# Patient Record
Sex: Female | Born: 1960 | Race: White | Hispanic: No | Smoking: Current every day smoker
Health system: Southern US, Community
[De-identification: ages and names within clinical notes are randomized; demographics above are authoritative.]

## PROBLEM LIST (undated history)

## (undated) DIAGNOSIS — J449 Chronic obstructive pulmonary disease, unspecified: Secondary | ICD-10-CM

## (undated) HISTORY — PX: OTHER SURGICAL HISTORY: SHX169

## (undated) HISTORY — DX: Chronic obstructive pulmonary disease, unspecified: J44.9

## (undated) HISTORY — PX: ECTOPIC PREGNANCY SURGERY: SHX613

---

## 2005-02-24 ENCOUNTER — Ambulatory Visit: Payer: Self-pay

## 2012-06-11 ENCOUNTER — Emergency Department: Payer: Self-pay | Admitting: Unknown Physician Specialty

## 2012-06-17 ENCOUNTER — Emergency Department: Payer: Self-pay | Admitting: Unknown Physician Specialty

## 2017-09-02 ENCOUNTER — Other Ambulatory Visit: Payer: Self-pay

## 2017-09-02 ENCOUNTER — Ambulatory Visit: Payer: Medicaid Other

## 2017-09-02 ENCOUNTER — Ambulatory Visit
Admission: EM | Admit: 2017-09-02 | Discharge: 2017-09-02 | Disposition: A | Discharge status: Home / Self Care | Payer: Medicaid Other | Attending: Family Medicine | Admitting: Family Medicine

## 2017-09-02 DIAGNOSIS — M791 Myalgia, unspecified site: Secondary | ICD-10-CM | POA: Insufficient documentation

## 2017-09-02 DIAGNOSIS — R509 Fever, unspecified: Secondary | ICD-10-CM | POA: Insufficient documentation

## 2017-09-02 DIAGNOSIS — J9801 Acute bronchospasm: Secondary | ICD-10-CM | POA: Insufficient documentation

## 2017-09-02 DIAGNOSIS — J111 Influenza due to unidentified influenza virus with other respiratory manifestations: Secondary | ICD-10-CM

## 2017-09-02 DIAGNOSIS — R05 Cough: Secondary | ICD-10-CM | POA: Diagnosis not present

## 2017-09-02 DIAGNOSIS — R69 Illness, unspecified: Secondary | ICD-10-CM | POA: Diagnosis not present

## 2017-09-02 DIAGNOSIS — R062 Wheezing: Secondary | ICD-10-CM

## 2017-09-02 DIAGNOSIS — F17210 Nicotine dependence, cigarettes, uncomplicated: Secondary | ICD-10-CM | POA: Insufficient documentation

## 2017-09-02 LAB — RAPID INFLUENZA A&B ANTIGENS: Influenza A (ARMC): NEGATIVE

## 2017-09-02 LAB — RAPID INFLUENZA A&B ANTIGENS (ARMC ONLY): INFLUENZA B (ARMC): NEGATIVE

## 2017-09-02 MED ORDER — DOXYCYCLINE HYCLATE 100 MG PO TABS: 100.0000 mg | ORAL_TABLET | Freq: Two times a day (BID) | ORAL | 0 refills | Status: DC

## 2017-09-02 MED ORDER — OSELTAMIVIR PHOSPHATE 75 MG PO CAPS: 75.0000 mg | ORAL_CAPSULE | Freq: Two times a day (BID) | ORAL | 0 refills | Status: DC

## 2017-09-02 MED ORDER — IPRATROPIUM-ALBUTEROL 0.5-2.5 (3) MG/3ML IN SOLN
3.0000 mL | Freq: Once | RESPIRATORY_TRACT | Status: AC
  Administered 2017-09-02: 3 mL via RESPIRATORY_TRACT

## 2017-09-02 MED ORDER — PREDNISONE 20 MG PO TABS: ORAL_TABLET | ORAL | 0 refills | Status: DC

## 2017-09-02 MED ORDER — ALBUTEROL SULFATE HFA 108 (90 BASE) MCG/ACT IN AERS: 1.0000 | INHALATION_SPRAY | Freq: Four times a day (QID) | RESPIRATORY_TRACT | 0 refills | Status: DC | PRN

## 2017-09-02 NOTE — ED Triage Notes (Signed)
Patient c/o fever, cough, congestion and body aches since Monday.

## 2017-09-02 NOTE — ED Provider Notes (Signed)
[facility]    CSN: [id number] Arrival date & time: 09/02/17  1653     History   Chief Complaint Chief Complaint  Patient presents with  . Fever    HPI Kim Mcintyre is a 57 y.o. female.   The history is provided by the patient.  Fever  Associated symptoms: congestion, cough and myalgias   URI  Presenting symptoms: congestion, cough and fever   Severity:  Moderate Onset quality:  Sudden Duration:  4 days Timing:  Constant Progression:  Worsening Chronicity:  New Relieved by:  None tried Ineffective treatments:  None tried Associated symptoms: myalgias and wheezing   Associated symptoms: no sinus pain   Risk factors: chronic respiratory disease (unknown however patient chronic lifelong smoker) and sick contacts   Risk factors: not elderly, no chronic cardiac disease, no chronic kidney disease, no diabetes mellitus, no immunosuppression, no recent illness and no recent travel     History reviewed. No pertinent past medical history.  There are no active problems to display for this patient.   History reviewed. No pertinent surgical history.  OB History    No data available       Home Medications    Prior to Admission medications   Medication Sig Start Date End Date Taking? Authorizing Provider  albuterol (PROVENTIL HFA;VENTOLIN HFA) 108 (90 Base) MCG/ACT inhaler Inhale 1-2 puffs into the lungs every 6 (six) hours as needed for wheezing or shortness of breath. 09/02/17   Payton Mccallumonty, Ashaad Gaertner, MD  doxycycline (VIBRA-TABS) 100 MG tablet Take 1 tablet (100 mg total) by mouth 2 (two) times daily. 09/02/17   Payton Mccallumonty, Julius Boniface, MD  oseltamivir (TAMIFLU) 75 MG capsule Take 1 capsule (75 mg total) by mouth 2 (two) times daily. 09/02/17   Payton Mccallumonty, Kingston Guiles, MD  predniSONE (DELTASONE) 20 MG tablet 3 tabs po qd x 2 days, then 2 tabs po qd x 3 days, then 1 tab po qd x 3 days, then half a tab po qd x 2 days 09/02/17   Payton Mccallumonty, Sherman Donaldson, MD    Family History Family History  Problem  Relation Age of Onset  . Cancer Mother        lung  . Cancer Father        throat    Social History Social History   Tobacco Use  . Smoking status: Current Every Day Smoker  . Smokeless tobacco: Never Used  Substance Use Topics  . Alcohol use: Not on file  . Drug use: Not on file     Allergies   Patient has no known allergies.   Review of Systems Review of Systems  Constitutional: Positive for fever.  HENT: Positive for congestion. Negative for sinus pain.   Respiratory: Positive for cough and wheezing.   Musculoskeletal: Positive for myalgias.     Physical Exam Triage Vital Signs ED Triage Vitals  Enc Vitals Group     BP 09/02/17 1734 (!) 111/48     Pulse Rate 09/02/17 1734 80     Resp --      Temp 09/02/17 1734 (!) 101.6 F (38.7 C)     Temp Source 09/02/17 1734 Oral     SpO2 09/02/17 1734 90 %     Weight 09/02/17 1735 105 lb (47.6 kg)     Height 09/02/17 1735 4\' 11"  (1.499 m)     Head Circumference --      Peak Flow --      Pain Score 09/02/17 1735 10  Pain Loc --      Pain Edu? --      Excl. in GC? --    No data found.  Updated Vital Signs BP (!) 111/48   Pulse 80   Temp (!) 101.6 F (38.7 C) (Oral)   Ht 4\' 11"  (1.499 m)   Wt 105 lb (47.6 kg)   SpO2 92%   BMI 21.21 kg/m   Visual Acuity Right Eye Distance:   Left Eye Distance:   Bilateral Distance:    Right Eye Near:   Left Eye Near:    Bilateral Near:     Physical Exam  Constitutional: She appears well-developed and well-nourished. No distress.  HENT:  Head: Normocephalic and atraumatic.  Nose: Rhinorrhea present. No mucosal edema, nose lacerations, sinus tenderness, nasal deformity, septal deviation or nasal septal hematoma. No epistaxis.  No foreign bodies. Right sinus exhibits no maxillary sinus tenderness and no frontal sinus tenderness. Left sinus exhibits no maxillary sinus tenderness and no frontal sinus tenderness.  Mouth/Throat: Uvula is midline, oropharynx is clear and  moist and mucous membranes are normal. No oropharyngeal exudate, posterior oropharyngeal edema or posterior oropharyngeal erythema. No tonsillar exudate.  Neck: Normal range of motion. Neck supple. No thyromegaly present.  Cardiovascular: Normal rate, regular rhythm and normal heart sounds.  Pulmonary/Chest: Effort normal. No stridor. No respiratory distress. She has wheezes (and diffuse rhonchi bilaterally). She has no rales.  Lymphadenopathy:    She has no cervical adenopathy.  Skin: She is not diaphoretic.  Nursing note and vitals reviewed.    UC Treatments / Results  Labs (all labs ordered are listed, but only abnormal results are displayed) Labs Reviewed  RAPID INFLUENZA A&B ANTIGENS ([facility] ONLY)    EKG  EKG Interpretation None       Radiology Dg Chest 2 View  Result Date: 09/02/2017 CLINICAL DATA:  Cough and fever EXAM: CHEST  2 VIEW COMPARISON:  Report 03/21/2003 FINDINGS: The heart size and mediastinal contours are within normal limits. Both lungs are clear. The visualized skeletal structures are unremarkable. IMPRESSION: No active cardiopulmonary disease. Electronically Signed   By: Jasmine Pang M.D.   On: 09/02/2017 18:15    Procedures Procedures (including critical care time)  Medications Ordered in UC Medications  ipratropium-albuterol (DUONEB) 0.5-2.5 (3) MG/3ML nebulizer solution 3 mL (3 mLs Nebulization Given 09/02/17 1754)     Initial Impression / Assessment and Plan / UC Course  I have reviewed the triage vital signs and the nursing notes.  Pertinent labs & imaging results that were available during my care of the patient were reviewed by me and considered in my medical decision making (see chart for details).   O2 sat after neb tx: 93-94% at rest 93% during ambulation    Final Clinical Impressions(s) / UC Diagnoses   Final diagnoses:  Influenza-like illness  Bronchospasm  Cigarette smoker    ED Discharge Orders        Ordered    doxycycline  (VIBRA-TABS) 100 MG tablet  2 times daily     09/02/17 1839    oseltamivir (TAMIFLU) 75 MG capsule  2 times daily     09/02/17 1839    predniSONE (DELTASONE) 20 MG tablet     09/02/17 1839    albuterol (PROVENTIL HFA;VENTOLIN HFA) 108 (90 Base) MCG/ACT inhaler  Every 6 hours PRN     09/02/17 1839     1. x-ray results and diagnosis reviewed with patient and daughter 2. Patient given duoneb tx x 1  with improvement of symptoms 3. rx as per orders above; reviewed possible side effects, interactions, risks and benefits  4. Recommend supportive treatment with rest, fluids  5. Follow-up prn if symptoms worsen or don't improve  Controlled Substance Prescriptions [place] Controlled Substance Registry consulted? Not Applicable   Payton Mccallum, MD 09/02/17 [phone]

## 2017-09-05 ENCOUNTER — Telehealth: Payer: Self-pay

## 2017-09-05 NOTE — Telephone Encounter (Signed)
Called to follow up with patient since visit here at [facility]. Spoke with pt, reports improvement.Patient instructed to call back with any questions or concerns. MAH  

## 2017-09-06 ENCOUNTER — Ambulatory Visit: Payer: Self-pay | Admitting: Nurse Practitioner

## 2017-09-15 ENCOUNTER — Ambulatory Visit: Payer: Self-pay | Admitting: Nurse Practitioner

## 2017-10-04 ENCOUNTER — Other Ambulatory Visit: Payer: Self-pay

## 2017-10-04 ENCOUNTER — Ambulatory Visit: Payer: Medicaid Other | Admitting: Nurse Practitioner

## 2017-10-04 ENCOUNTER — Encounter: Payer: Self-pay | Admitting: Nurse Practitioner

## 2017-10-04 VITALS — BP 134/87 | HR 78 | Temp 98.2°F | Resp 18 | Ht 58.5 in | Wt 95.4 lb

## 2017-10-04 DIAGNOSIS — Z7689 Persons encountering health services in other specified circumstances: Secondary | ICD-10-CM | POA: Diagnosis not present

## 2017-10-04 DIAGNOSIS — F419 Anxiety disorder, unspecified: Secondary | ICD-10-CM

## 2017-10-04 DIAGNOSIS — M8949 Other hypertrophic osteoarthropathy, multiple sites: Secondary | ICD-10-CM

## 2017-10-04 DIAGNOSIS — M150 Primary generalized (osteo)arthritis: Secondary | ICD-10-CM | POA: Diagnosis not present

## 2017-10-04 DIAGNOSIS — M159 Polyosteoarthritis, unspecified: Secondary | ICD-10-CM

## 2017-10-04 DIAGNOSIS — J431 Panlobular emphysema: Secondary | ICD-10-CM | POA: Diagnosis not present

## 2017-10-04 MED ORDER — DICLOFENAC SODIUM 75 MG PO TBEC: 75.0000 mg | DELAYED_RELEASE_TABLET | Freq: Two times a day (BID) | ORAL | 0 refills | Status: DC

## 2017-10-04 MED ORDER — UMECLIDINIUM BROMIDE 62.5 MCG/INH IN AEPB: 1.0000 | INHALATION_SPRAY | Freq: Every day | RESPIRATORY_TRACT | 5 refills | Status: DC

## 2017-10-04 MED ORDER — PAROXETINE HCL 20 MG PO TABS: 20.0000 mg | ORAL_TABLET | Freq: Every day | ORAL | 2 refills | Status: DC

## 2017-10-04 NOTE — Progress Notes (Signed)
Subjective:    Patient ID: Kim Mcintyre, female    DOB: Aug 18, 1960, 57 y.o.   MRN: [id number]  Kim Mcintyre is a 57 y.o. female presenting on 10/04/2017 for Establish Care (COPD, intermittent cramps. pt husband passed away x 06/10/2017.)   HPI Establish Care New Provider Pt last seen by PCP about 30 years ago.  Now is qualified for medicaid and has access to medical care.  Was previously uninsured.  She has recently lost her husband to medical complications.   COPD Recently is smoking 3/4 - 1 ppd.  Prior was smoking 1.5 ppd x 20 years approx.  approx 50 pack-year smoking history. - has had difficulty breathing with activity x last 5-6 years. She is limited to housework and slow walking for distances shorter than 15 minutes duration prior to having shortness of breath.  - Used husband's inhaler for last 5-6 years intermittently.  Symbicort has been helpful.  She has never had her own prescribed inhalers.  Cramps  Intermittent arm cramps in arms, fingers get very contracted and has occurred intermittently for 2-3 years.  There has been no predictable pattern to the cramps.   Anxiety Pt reports significant difficulty with being nervous and anxious.  This is impacting her ability to rest, sleep, fall asleep, and leave her home. She also reports night sweats/hot flashes and is willing to try a medication that would help both her anxiety and hot flashes.  GAD 7 : Generalized Anxiety Score 10/04/2017  Nervous, Anxious, on Edge 3  Control/stop worrying 2  Worry too much - different things 2  Trouble relaxing 3  Restless 3  Easily annoyed or irritable 3  Afraid - awful might happen 1  Total GAD 7 Score 17  Anxiety Difficulty Not difficult at all     Depression screen [facility] 2/9 10/04/2017  Decreased Interest 1  Down, Depressed, Hopeless 1  PHQ - 2 Score 2  Altered sleeping 3  Tired, decreased energy 1  Change in appetite 3  Feeling bad or failure about yourself  1  Trouble concentrating 1    Moving slowly or fidgety/restless 1  Suicidal thoughts 0  PHQ-9 Score 12     Past Medical History:  Diagnosis Date  . COPD (chronic obstructive pulmonary disease) (HCC)    History reviewed. No pertinent surgical history. Social History   Socioeconomic History  . Marital status: Widowed    Spouse name: Not on file  . Number of children: Not on file  . Years of education: Not on file  . Highest education level: Not on file  Occupational History  . Not on file  Social Needs  . Financial resource strain: Not on file  . Food insecurity:    Worry: Not on file    Inability: Not on file  . Transportation needs:    Medical: Not on file    Non-medical: Not on file  Tobacco Use  . Smoking status: Current Every Day Smoker    Packs/day: 1.00    Years: 40.00    Pack years: 40.00  . Smokeless tobacco: Never Used  Substance and Sexual Activity  . Alcohol use: No    Frequency: Never  . Drug use: No  . Sexual activity: Not on file  Lifestyle  . Physical activity:    Days per week: Not on file    Minutes per session: Not on file  . Stress: Not on file  Relationships  . Social connections:    Talks on phone: Not  on file    Gets together: Not on file    Attends religious service: Not on file    Active member of club or organization: Not on file    Attends meetings of clubs or organizations: Not on file    Relationship status: Not on file  . Intimate partner violence:    Fear of current or ex partner: Not on file    Emotionally abused: Not on file    Physically abused: Not on file    Forced sexual activity: Not on file  Other Topics Concern  . Not on file  Social History Narrative  . Not on file   Family History  Problem Relation Age of Onset  . Cancer Mother        lung  . Cancer Father        throat   No current outpatient medications on file prior to visit.   No current facility-administered medications on file prior to visit.     Review of Systems   Constitutional: Negative.   HENT: Negative.   Eyes: Negative.   Respiratory: Positive for cough and shortness of breath.   Cardiovascular: Negative.   Gastrointestinal: Negative.   Endocrine: Negative.   Genitourinary: Negative.   Musculoskeletal: Positive for arthralgias and myalgias.  Skin: Negative.   Allergic/Immunologic: Negative.   Neurological: Negative.   Hematological: Negative.   Psychiatric/Behavioral: Positive for sleep disturbance.   Per HPI unless specifically indicated above     Objective:    BP 134/87 (BP Location: Right Arm, Patient Position: Sitting, Cuff Size: Small)   Pulse 78   Temp 98.2 F (36.8 C) (Oral)   Resp 18   Ht 4' 10.5" (1.486 m)   Wt 95 lb 6.4 oz (43.3 kg)   SpO2 98%   BMI 19.60 kg/m   Wt Readings from Last 3 Encounters:  10/04/17 95 lb 6.4 oz (43.3 kg)  09/02/17 105 lb (47.6 kg)    Physical Exam  Constitutional: She is oriented to person, place, and time. She appears well-developed and well-nourished. No distress.  HENT:  Head: Normocephalic and atraumatic.  Eyes: Pupils are equal, round, and reactive to light. Conjunctivae are normal.  Neck: Normal range of motion. Neck supple. Carotid bruit is not present.  Cardiovascular: Normal rate, regular rhythm, S1 normal, S2 normal, normal heart sounds and intact distal pulses.  Pulmonary/Chest: Effort normal. No respiratory distress. She has wheezes (Inspiratory and expiratory throughout all lobes).  Decreased air movement throughout  Musculoskeletal: She exhibits no edema (pedal).  Neurological: She is alert and oriented to person, place, and time.  Skin: Skin is warm and dry.  Psychiatric: She has a normal mood and affect. Her behavior is normal.  Vitals reviewed.       Assessment & Plan:   Problem List Items Addressed This Visit    None    Visit Diagnoses    Anxiety    -  Primary Patient with moderate level of anxiety that prevents adequate sleep.  She also has vasomotor hot  flashes from menopause.  Some anxieties related to grief process losing her husband.  Patient desires to start medication.  Gad 7 score indicates severe anxiety.  Plan: 1.  Start paroxetine 20 mg once daily. 2.  Encouraged adequate stress management strategies. 3.  Encouraged patient to consider grief counseling with hospice and palliative care for [facility]. 4.  Follow-up 6 weeks   Relevant Medications   PARoxetine (PAXIL) 20 MG tablet   Panlobular emphysema (  HCC)     Symptoms consistent with chronic COPD that was  previously undiagnosed.  Patient has not had any evaluation in the last 30 years.  She has 50-pack-year smoking history and is currently working to reduce smoking to quit.  One motivator as her husband died from complications of COPD.  Plan: 1.  Start Spiriva 1 inhalation once daily 2.  Consider Symbicort if Spiriva does not fully control symptoms. 3.  Start albuterol 1-2 puffs every 6 hours as needed for wheezing and shortness of breath. 4.  Consider PFTs at next visit. 5.  Follow-up every 3 months or sooner if needed.  Patient with multiple pole arthralgias   Relevant Medications   tiotropium (SPIRIVA HANDIHALER) 18 MCG inhalation capsule   albuterol (PROVENTIL HFA;VENTOLIN HFA) 108 (90 Base) MCG/ACT inhaler   Primary osteoarthritis involving multiple joints     Patient with multiple arthralgias and inadequate conservative therapy to date.  Plan: 1.  Start meloxicam 7.5 mg once daily as needed for arthralgia. 2.  Encouraged regular physical activity to improve experience of pain. 3.  Follow-up as needed.   Relevant Medications   meloxicam (MOBIC) 7.5 MG tablet   Encounter to establish care     Patient without prior medical care for the last 30 years.  Past medical, family, and surgical history reviewed w/ pt.        Meds ordered this encounter  Medications  . DISCONTD: umeclidinium bromide (INCRUSE ELLIPTA) 62.5 MCG/INH AEPB    Sig: Inhale 1 puff into the  lungs daily.    Dispense:  30 each    Refill:  5    Order Specific Question:   Supervising Provider    Answer:   Smitty Cords [[id number]]  . PARoxetine (PAXIL) 20 MG tablet    Sig: Take 1 tablet (20 mg total) by mouth daily.    Dispense:  30 tablet    Refill:  2    Order Specific Question:   Supervising Provider    Answer:   Smitty Cords [[id number]]  . DISCONTD: diclofenac (VOLTAREN) 75 MG EC tablet    Sig: Take 1 tablet (75 mg total) by mouth 2 (two) times daily.    Dispense:  30 tablet    Refill:  0    Order Specific Question:   Supervising Provider    Answer:   Smitty Cords [[id number]]  . tiotropium (SPIRIVA HANDIHALER) 18 MCG inhalation capsule    Sig: Place 1 capsule (18 mcg total) into inhaler and inhale daily.    Dispense:  30 capsule    Refill:  12    Order Specific Question:   Supervising Provider    Answer:   Smitty Cords [[id number]]  . meloxicam (MOBIC) 7.5 MG tablet    Sig: Take 1 tablet (7.5 mg total) by mouth daily.    Dispense:  30 tablet    Refill:  0    Order Specific Question:   Supervising Provider    Answer:   Smitty Cords [[id number]]  . albuterol (PROVENTIL HFA;VENTOLIN HFA) 108 (90 Base) MCG/ACT inhaler    Sig: Inhale 1-2 puffs into the lungs every 6 (six) hours as needed for wheezing or shortness of breath.    Dispense:  1 Inhaler    Refill:  0    Product selection for brand and insurance/pt preference is permitted.    Order Specific Question:   Supervising Provider    Answer:   Smitty Cords [[id number]]  Follow up plan: Return in about 6 weeks (around 11/15/2017) for anxiety.  Wilhelmina Mcardle, DNP, AGPCNP-BC Adult Gerontology Primary Care Nurse Practitioner [facility] 10/23/2017, 1:23 PM

## 2017-10-04 NOTE — Patient Instructions (Addendum)
Kim Mcintyre, Thank you for coming in to clinic today.  1. START incruse inhaler one puff once daily.  2. CONTINUE albuterol 1-2 puffs every 4 hours as needed.  3. For your anxiety and hot flashes.  Take paroxetine 20 mg one tablet once daily.  4. For your aches and pains - take diclofenac 75 mg twice daily. Do not take any additional Advil, Motrin, ibuprofen, or aspirin.  Only take acetaminophen (Tylenol) for other pain when taking diclofenac.  Please schedule a follow-up appointment with Kim Mcintyre, AGNP. Return in about 6 weeks (around 11/15/2017) for anxiety.  If you have any other questions or concerns, please feel free to call the clinic or send a message through MyChart. You may also schedule an earlier appointment if necessary.  You will receive a survey after today's visit either digitally by e-mail or paper by [employer] mail. Your experiences and feedback matter to us.  Please respond so we know how we are doing as we provide care for you.   Kim McardleLauren Cloie Wooden, DNP, AGNP-BC Adult Gerontology Nurse Practitioner [facility], [facility]

## 2017-10-06 ENCOUNTER — Telehealth: Payer: Self-pay

## 2017-10-06 MED ORDER — TIOTROPIUM BROMIDE MONOHYDRATE 18 MCG IN CAPS: 18.0000 ug | ORAL_CAPSULE | Freq: Every day | RESPIRATORY_TRACT | 12 refills | Status: DC

## 2017-10-06 MED ORDER — MELOXICAM 7.5 MG PO TABS: 7.5000 mg | ORAL_TABLET | Freq: Every day | ORAL | 0 refills | Status: DC

## 2017-10-06 MED ORDER — ALBUTEROL SULFATE HFA 108 (90 BASE) MCG/ACT IN AERS: 1.0000 | INHALATION_SPRAY | Freq: Four times a day (QID) | RESPIRATORY_TRACT | 0 refills | Status: DC | PRN

## 2017-10-06 NOTE — Telephone Encounter (Signed)
The pharmacist from [facility] called stating that the Incruse inhaler  & Diclofenac are both not covered through medicaid. He recommended that usually Sprivia and Mobic are covered alternatives.

## 2017-10-23 ENCOUNTER — Encounter: Payer: Self-pay | Admitting: Nurse Practitioner

## 2017-11-15 ENCOUNTER — Encounter: Payer: Self-pay | Admitting: Nurse Practitioner

## 2017-11-15 ENCOUNTER — Other Ambulatory Visit: Payer: Self-pay

## 2017-11-15 ENCOUNTER — Ambulatory Visit (INDEPENDENT_AMBULATORY_CARE_PROVIDER_SITE_OTHER): Payer: Medicaid Other | Admitting: Nurse Practitioner

## 2017-11-15 VITALS — BP 116/70 | HR 80 | Temp 98.4°F | Ht 58.5 in | Wt 98.6 lb

## 2017-11-15 DIAGNOSIS — J431 Panlobular emphysema: Secondary | ICD-10-CM | POA: Diagnosis not present

## 2017-11-15 DIAGNOSIS — F419 Anxiety disorder, unspecified: Secondary | ICD-10-CM

## 2017-11-15 MED ORDER — HYDROXYZINE HCL 25 MG PO TABS: 12.5000 mg | ORAL_TABLET | Freq: Three times a day (TID) | ORAL | 2 refills | Status: DC | PRN

## 2017-11-15 MED ORDER — ALBUTEROL SULFATE HFA 108 (90 BASE) MCG/ACT IN AERS: 1.0000 | INHALATION_SPRAY | Freq: Four times a day (QID) | RESPIRATORY_TRACT | 2 refills | Status: DC | PRN

## 2017-11-15 MED ORDER — BUDESONIDE-FORMOTEROL FUMARATE 160-4.5 MCG/ACT IN AERO: 2.0000 | INHALATION_SPRAY | Freq: Two times a day (BID) | RESPIRATORY_TRACT | 3 refills | Status: DC

## 2017-11-15 MED ORDER — BUSPIRONE HCL 7.5 MG PO TABS: 7.5000 mg | ORAL_TABLET | Freq: Two times a day (BID) | ORAL | 1 refills | Status: DC

## 2017-11-15 NOTE — Patient Instructions (Addendum)
Kim Mcintyre,   Thank you for coming in to clinic today.  1. START hydroxyzine 25 mg tablet.  Take 1/2 - 1 tablet up to three times daily for panic.  This will help you within a few minutes.  2. START buspirone 7.5 mg tablet.  Take 1 tablet twice daily to control anxiety and panic.  This is a daily medication.  3. For COPD: START symbicort two puffs twice daily. - Continue albuterol 1-2 puffs every 6 hours as needed for wheezing.  Please schedule a follow-up appointment with Wilhelmina McardleLauren Kamalei Roeder, AGNP. Return in about 1 month (around 12/13/2017) for anxiety.  If you have any other questions or concerns, please feel free to call the clinic or send a message through MyChart. You may also schedule an earlier appointment if necessary.  You will receive a survey after today's visit either digitally by e-mail or paper by [employer] mail. Your experiences and feedback matter to us.  Please respond so we know how we are doing as we provide care for you.   Wilhelmina McardleLauren Wali Reinheimer, DNP, AGNP-BC Adult Gerontology Nurse Practitioner [facility], [facility]

## 2017-11-15 NOTE — Progress Notes (Signed)
Subjective:    Patient ID: Kim Mcintyre, female    DOB: 06-09-1961, 57 y.o.   MRN: [id number]  Kim Mcintyre is a 57 y.o. female presenting on 11/15/2017 for COPD (pt needs a refill on a inhaler, she also requesting  another inhaler because the [place] made her dizzy.) and Anxiety (pt states the paxil made her emotional )   HPI Anxiety - Has had increased depression, moodiness, tearfulness with paxil. - Is having panic attacks several times daily and still desires to start medication to help symptoms.  This week her daughter saw her walking outside her home in her neighborhood, which is very unusual as pt has pain with walking.  Pt stated at that time that she "had to get out and do something" because of her worry and anxiety. - Anxiety is complicated by grief of her husband who died November 2018.  She is well connected with her daughter and faith-based counselors.  She is currently declining any formal bereavement program.  COPD Spiriva - tried 3 times and had dizziness and lightheadedness with this medication.  She has used Symbicort intermittently in the past and tolerated it well.   Symbicort - Albuterol daily use since last visit.   - Continues to smoke average 1/2 ppd.  She smokes more on days with higher anxiety, occasionally only 3-4 per day, and occasionally "almost" chain smoking.  [facility] 11/15/2017 10/04/2017  Decreased Interest 0 1  Down, Depressed, Hopeless 0 1  PHQ - 2 Score 0 2  Altered sleeping 0 3  Tired, decreased energy 0 1  Change in appetite 0 3  Feeling bad or failure about yourself  0 1  Trouble concentrating 0 1  Moving slowly or fidgety/restless 0 1  Suicidal thoughts 0 0  PHQ-9 Score 0 12  Difficult doing work/chores Not difficult at all -    GAD 7 : Generalized Anxiety Score 11/15/2017 10/04/2017  Nervous, Anxious, on Edge 0 3  Control/stop worrying 0 2  Worry too much - different things 0 2  Trouble relaxing 0 3  Restless 0 3  Easily  annoyed or irritable 1 3  Afraid - awful might happen 0 1  Total GAD 7 Score 1 17  Anxiety Difficulty Not difficult at all Not difficult at all    Social History   Tobacco Use  . Smoking status: Current Every Day Smoker    Packs/day: 1.00    Years: 40.00    Pack years: 40.00  . Smokeless tobacco: Never Used  Substance Use Topics  . Alcohol use: No    Frequency: Never  . Drug use: No    Review of Systems Per HPI unless specifically indicated above     Objective:    BP 116/70 (BP Location: Right Arm, Patient Position: Sitting, Cuff Size: Small)   Pulse 80   Temp 98.4 F (36.9 C) (Oral)   Ht 4' 10.5" (1.486 m)   Wt 98 lb 9.6 oz (44.7 kg)   SpO2 96%   BMI 20.26 kg/m   Wt Readings from Last 3 Encounters:  11/15/17 98 lb 9.6 oz (44.7 kg)  10/04/17 95 lb 6.4 oz (43.3 kg)  09/02/17 105 lb (47.6 kg)    Physical Exam  Constitutional: She is oriented to person, place, and time. She appears well-developed and well-nourished. No distress.  HENT:  Head: Normocephalic and atraumatic.  Cardiovascular: Normal rate, regular rhythm, S1 normal, S2 normal, normal heart sounds and intact distal pulses.  Pulmonary/Chest:  Effort normal. No accessory muscle usage. No respiratory distress. She has decreased breath sounds (throughout all lobes). She has no wheezes. She has no rhonchi. She has no rales.  Barrel-chested  Neurological: She is alert and oriented to person, place, and time.  Skin: Skin is warm and dry.  Psychiatric: Judgment and thought content normal. She is withdrawn. She exhibits a depressed mood. She expresses no homicidal and no suicidal ideation. She expresses no suicidal plans and no homicidal plans. She is communicative (appropriate conversation with good relay of information).  Vitals reviewed.      Results for orders placed or performed during the hospital encounter of 09/02/17  Rapid Influenza A&B Antigens ([facility] only)  Result Value Ref Range   Influenza A ([facility])  NEGATIVE NEGATIVE   Influenza B ([facility]) NEGATIVE NEGATIVE      Assessment & Plan:   Problem List Items Addressed This Visit    None    Visit Diagnoses    Anxiety    -  Primary Remains uncontrolled with multiple episodes of panic per day (1-3).  Some days also has higher general anxiety level as well.  Pt did not tolerate paxil as started at last visit and stopped taking.    Plan: 1. STOP paroxetine. 2. START hydroxyzine 25 mg tablet.  Take 1/2-1 tablet up to three times daily for panic. 3. START buspirone 7.5 mg tablet twice daily for baseline anxiety management. 4. Continue grieving process with use of counselors and family for support. 5. Followup 4 weeks.  If medications stable and tolerated well, will transition to 3 month followup.   Relevant Medications   hydrOXYzine (ATARAX/VISTARIL) 25 MG tablet   busPIRone (BUSPAR) 7.5 MG tablet   Panlobular emphysema (HCC)     Uncontrolled COPD.  No recent spirometry testing performed, likely never performed.  Pt with high use of albuterol inhaler as pt did not continue maintenance inhaler after Spiriva was used and had side effects.  Not ready to completely quit smoking as this is stress reliever currently.  Plan: 1. Continue albuterol - only 1- 2 puffs every 6 hours prn wheezing and shortness of breath. 2. START Symbicort 2 puffs twice daily. 3. Consider PFT in future. 4. Encouraged alternative stress management strategies for smoking cessation. 5. Followup 4 weeks.   Relevant Medications   albuterol (PROVENTIL HFA;VENTOLIN HFA) 108 (90 Base) MCG/ACT inhaler   budesonide-formoterol (SYMBICORT) 160-4.5 MCG/ACT inhaler      Meds ordered this encounter  Medications  . hydrOXYzine (ATARAX/VISTARIL) 25 MG tablet    Sig: Take 0.5-1 tablets (12.5-25 mg total) by mouth 3 (three) times daily as needed for anxiety.    Dispense:  60 tablet    Refill:  2    Order Specific Question:   Supervising Provider    Answer:   Smitty Cords  [[id number]]  . busPIRone (BUSPAR) 7.5 MG tablet    Sig: Take 1 tablet (7.5 mg total) by mouth 2 (two) times daily.    Dispense:  60 tablet    Refill:  1    Order Specific Question:   Supervising Provider    Answer:   Smitty Cords [[id number]]  . albuterol (PROVENTIL HFA;VENTOLIN HFA) 108 (90 Base) MCG/ACT inhaler    Sig: Inhale 1-2 puffs into the lungs every 6 (six) hours as needed for wheezing or shortness of breath.    Dispense:  1 Inhaler    Refill:  2    Product selection for brand and insurance/pt preference is permitted.  Order Specific Question:   Supervising Provider    Answer:   Smitty Cords [[id number]]  . budesonide-formoterol (SYMBICORT) 160-4.5 MCG/ACT inhaler    Sig: Inhale 2 puffs into the lungs 2 (two) times daily.    Dispense:  1 Inhaler    Refill:  3    Order Specific Question:   Supervising Provider    Answer:   Smitty Cords [[id number]]    Follow up plan: Return in about 1 month (around 12/13/2017) for anxiety.  Wilhelmina Mcardle, DNP, AGPCNP-BC Adult Gerontology Primary Care Nurse Practitioner [facility] 11/15/2017, 4:15 PM

## 2017-12-13 ENCOUNTER — Ambulatory Visit: Payer: Medicaid Other | Admitting: Nurse Practitioner

## 2018-01-05 ENCOUNTER — Ambulatory Visit: Payer: Medicaid Other | Admitting: Nurse Practitioner

## 2018-06-23 ENCOUNTER — Other Ambulatory Visit: Payer: Self-pay | Admitting: Nurse Practitioner

## 2018-06-23 DIAGNOSIS — J431 Panlobular emphysema: Secondary | ICD-10-CM

## 2018-08-18 ENCOUNTER — Encounter: Payer: Self-pay | Admitting: Nurse Practitioner

## 2018-08-18 ENCOUNTER — Ambulatory Visit: Payer: Medicaid Other | Admitting: Nurse Practitioner

## 2018-08-18 ENCOUNTER — Other Ambulatory Visit: Payer: Self-pay

## 2018-08-18 DIAGNOSIS — F4329 Adjustment disorder with other symptoms: Secondary | ICD-10-CM

## 2018-08-18 DIAGNOSIS — J431 Panlobular emphysema: Secondary | ICD-10-CM | POA: Diagnosis not present

## 2018-08-18 DIAGNOSIS — F4381 Prolonged grief disorder: Secondary | ICD-10-CM

## 2018-08-18 MED ORDER — BUDESONIDE-FORMOTEROL FUMARATE 160-4.5 MCG/ACT IN AERO: 2.0000 | INHALATION_SPRAY | Freq: Two times a day (BID) | RESPIRATORY_TRACT | 4 refills | Status: AC

## 2018-08-18 MED ORDER — ALBUTEROL SULFATE HFA 108 (90 BASE) MCG/ACT IN AERS: 1.0000 | INHALATION_SPRAY | Freq: Four times a day (QID) | RESPIRATORY_TRACT | 5 refills | Status: DC | PRN

## 2018-08-18 MED ORDER — TIOTROPIUM BROMIDE MONOHYDRATE 18 MCG IN CAPS: 18.0000 ug | ORAL_CAPSULE | Freq: Every day | RESPIRATORY_TRACT | 4 refills | Status: AC

## 2018-08-18 NOTE — Progress Notes (Signed)
Subjective:    Patient ID: Kim Mcintyre, female    DOB: Jul 06, 1961, 58 y.o.   MRN: [id number]  Kim DanasDale Riggenbach is a 58 y.o. female presenting on 08/18/2018 for Emphysema (pt need a refill on her inhaler) and Anxiety (pt decline addressing anxiety issues, because none of the medication helped )   HPI COPD Breathing, is "okay I guess."  Some shortness of breath at rest.  With getting anxious/excited this is worse.  Patient notes she cannot take a walk of 5-10 mins without shortness of breath. - Higher dose of Symbicort helped significantly.   -  Patient is not regularly taking Spiriva.  Takes Symbicort sometimes 3-4 times daily. - Patient is current smoker and is not yet ready to quit.  Sometimes less than 1/2 ppd because "I can't breathe." She has cut back some from past use over the last 1 year.  Grief Patient is still struggling with death of her husband.  Is again requesting very politely that unless we can prescribe Xanax she does not want to discuss her anxiety or depression.   - She continues to get out and visit with family/friends. - States she is not ready to address anxiety, depression, grief.  Did not have any response with buspirone and took for several weeks.  Does not wish to try any higher doses at this time.  Social History   Tobacco Use  . Smoking status: Current Every Day Smoker    Packs/day: 0.50    Years: 40.00    Pack years: 20.00  . Smokeless tobacco: Never Used  Substance Use Topics  . Alcohol use: No    Frequency: Never  . Drug use: No    Review of Systems Per HPI unless specifically indicated above     Objective:    BP 133/74 (BP Location: Right Arm, Patient Position: Sitting, Cuff Size: Small)   Pulse 73   Temp 98.6 F (37 C) (Oral)   Resp 17   Ht 4' 10.5" (1.486 m)   Wt 109 lb 12.8 oz (49.8 kg)   SpO2 98%   BMI 22.56 kg/m   Wt Readings from Last 3 Encounters:  08/18/18 109 lb 12.8 oz (49.8 kg)  11/15/17 98 lb 9.6 oz (44.7 kg)  10/04/17 95 lb 6.4  oz (43.3 kg)    Physical Exam Vitals signs reviewed.  Constitutional:      General: She is not in acute distress.    Appearance: She is well-developed.  HENT:     Head: Normocephalic and atraumatic.  Cardiovascular:     Rate and Rhythm: Normal rate and regular rhythm.     Pulses:          Radial pulses are 2+ on the right side and 2+ on the left side.       Posterior tibial pulses are 1+ on the right side and 1+ on the left side.     Heart sounds: Normal heart sounds, S1 normal and S2 normal.  Pulmonary:     Effort: Pulmonary effort is normal. Prolonged expiration present. No accessory muscle usage or respiratory distress.     Breath sounds: Decreased air movement present. Wheezing (throughout) and rales (throughout) present.  Musculoskeletal:     Right lower leg: No edema.     Left lower leg: No edema.  Skin:    General: Skin is warm and dry.     Capillary Refill: Capillary refill takes less than 2 seconds.  Neurological:     Mental Status:  She is alert and oriented to person, place, and time.  Psychiatric:        Attention and Perception: Attention normal.        Mood and Affect: Mood is depressed. Affect is labile.        Speech: She is communicative.        Behavior: Behavior is withdrawn. Behavior is cooperative.        Thought Content: Thought content normal. Thought content does not include homicidal or suicidal ideation. Thought content does not include homicidal or suicidal plan.        Cognition and Memory: Cognition and memory normal.        Judgment: Judgment normal.    Results for orders placed or performed during the hospital encounter of 09/02/17  Rapid Influenza A&B Antigens ([facility] only)  Result Value Ref Range   Influenza A ([facility]) NEGATIVE NEGATIVE   Influenza B ([facility]) NEGATIVE NEGATIVE      Assessment & Plan:   Problem List Items Addressed This Visit      Respiratory   Panlobular emphysema (HCC) Uncontrolled COPD with emphysema.  Patient not currently  taking Spiriva, only taking Symbicort and using more than prescribed.  GOLD status unknown without recent spirometry.  Plan: 1. Reinforced need to use rescue inhaler only if needing assistance with shortness of breath and wheezing. - Reordered Proair which is on formulary this year 2. Reinforced need to use Spiriva - no immediate change in breathing will be experienced with this med.  Continue 18 mcg once daily. 3. Continue Symbicort 160-4.5 mcg - 2 puffs twice daily.  Do not use additional doses. 4. PFT ordered for [facility] 5. Encouraged smoking cessation.  Discussion < 3 mins about cessation. 6. Follow-up 6 months.   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  Patient refused to answer screening questionnaire.  Refuses med adjustment here today. - Referrals provided for self-referral to psychiatry and counseling.  Offered assistance through this clinic in future if patient desires.  Patient verbalized understanding.      Meds ordered this encounter  Medications  . albuterol (PROAIR HFA) 108 (90 Base) MCG/ACT inhaler    Sig: Inhale 1-2 puffs into the lungs every 6 (six) hours as needed for wheezing or shortness of breath.    Dispense:  18 g    Refill:  5    Order Specific Question:   Supervising Provider    Answer:   Smitty Cords [[id number]]  . budesonide-formoterol (SYMBICORT) 160-4.5 MCG/ACT inhaler    Sig: Inhale 2 puffs into the lungs 2 (two) times daily. NEED APPT    Dispense:  3 Inhaler    Refill:  4    Order Specific Question:   Supervising Provider    Answer:   Smitty Cords  [[id number]]  . tiotropium (SPIRIVA HANDIHALER) 18 MCG inhalation capsule    Sig: Place 1 capsule (18 mcg total) into inhaler and inhale daily.    Dispense:  90 capsule    Refill:  4    Order Specific Question:   Supervising Provider    Answer:   Smitty Cords [[id number]]  Follow up plan: Return in about 6 months (around 02/16/2019) for COPD.  Wilhelmina McardleLauren Journii Nierman, DNP, AGPCNP-BC Adult Gerontology Primary Care Nurse Practitioner [facility] 08/18/2018, 1:52 PM

## 2018-08-18 NOTE — Patient Instructions (Addendum)
Kim Mcintyre,   Thank you for coming in to clinic today.  1. I encourage you to seek help for your anxiety ,depression and grief.  You can self- refer to any of the following locations: PSYCHIATRY / THERAPY-[place] Self Referral [facility] (All ages) [address], [place], [address] Phone: [phone] (Option 1) [email or web address]  [facility]) [place] [address], [place], [place] [address] Phone: [phone]  [employer], available walk-in 9am-4pm M-F [address] [place], [place] [address] Hours: 9am - 4pm (M-F, walk in available) [phone]   [facility]   Address: [address] [place], [place], [place] [address] Hours: 8AM-5PM (accepts walk in to establish) Phone: [phone]  [facility] [address] [place], [place] [address] [phone]  --------------------------------------------------------------- [facility] COUNSELING ONLY Internal Referral: Delight Ovens, LCSW [facility]  Self Referral: 1. [facility] [place] [facility]   Address: [address] [place], [place], [place] [address] Hours: Open today  9AM-7PM Phone: [phone]  2. Anell Barr [employer], [facility]  - [facility] Address: [address], [place], [place] [address] Phone: [phone]  2. [facility] will call for your lung function testing.  3. Continue Symbicort - Continue Spiriva once daily.  This takes regular use to make a change in your lung function. - ONLY use your rescue inhaler when you are having trouble breathing for extra doses - PROAIR - use 1-2 puffs up to every 6 hours for shortness of breath and wheezing.  Please schedule a follow-up appointment with Wilhelmina Mcardle, AGNP. Return in about 6 months (around 02/16/2019) for COPD.  If you have any other questions or concerns, please feel free to call the clinic or send a message through  MyChart. You may also schedule an earlier appointment if necessary.  You will receive a survey after today's visit either digitally by e-mail or paper by [employer]. Your experiences and feedback matter to [place].  Please respond so we know how we are doing as we provide care for you.   Wilhelmina Mcardle, DNP, AGNP-BC Adult Gerontology Nurse Practitioner [facility]

## 2018-09-07 ENCOUNTER — Ambulatory Visit: Payer: Medicaid Other | Admitting: Nurse Practitioner

## 2018-12-03 ENCOUNTER — Encounter: Payer: Self-pay | Admitting: Emergency Medicine

## 2018-12-03 ENCOUNTER — Other Ambulatory Visit: Payer: Self-pay

## 2018-12-03 ENCOUNTER — Emergency Department: Payer: Medicaid Other

## 2018-12-03 DIAGNOSIS — F17210 Nicotine dependence, cigarettes, uncomplicated: Secondary | ICD-10-CM | POA: Diagnosis not present

## 2018-12-03 DIAGNOSIS — Z79899 Other long term (current) drug therapy: Secondary | ICD-10-CM | POA: Insufficient documentation

## 2018-12-03 DIAGNOSIS — J029 Acute pharyngitis, unspecified: Secondary | ICD-10-CM | POA: Diagnosis present

## 2018-12-03 DIAGNOSIS — J449 Chronic obstructive pulmonary disease, unspecified: Secondary | ICD-10-CM | POA: Diagnosis not present

## 2018-12-03 LAB — CBC
HCT: 39.3 % (ref 36.0–46.0)
Hemoglobin: 13.5 g/dL (ref 12.0–15.0)
MCH: 34.1 pg — ABNORMAL HIGH (ref 26.0–34.0)
MCHC: 34.4 g/dL (ref 30.0–36.0)
MCV: 99.2 fL (ref 80.0–100.0)
Platelets: 175 10*3/uL (ref 150–400)
RBC: 3.96 MIL/uL (ref 3.87–5.11)
RDW: 12.1 % (ref 11.5–15.5)
WBC: 6.6 10*3/uL (ref 4.0–10.5)
nRBC: 0 % (ref 0.0–0.2)

## 2018-12-03 MED ORDER — SODIUM CHLORIDE 0.9% FLUSH: 3.0000 mL | Freq: Once | INTRAVENOUS | Status: DC

## 2018-12-03 NOTE — ED Triage Notes (Signed)
Pt reports chest pain that started yesterday; history of COPD and has productive cough for about a year; pt says she's still smoking; pt also reports pain on the right side of her throat x 1 week; voice hoarse;

## 2018-12-03 NOTE — ED Notes (Signed)
The more pt talks, she has a chirping noise when she talks; says swallowing hurts more on the right side of her throat; pt says "I know something bad is wrong"

## 2018-12-04 ENCOUNTER — Emergency Department
Admission: EM | Admit: 2018-12-04 | Discharge: 2018-12-04 | Disposition: A | Discharge status: Home / Self Care | Payer: Medicaid Other | Attending: Emergency Medicine | Admitting: Emergency Medicine

## 2018-12-04 ENCOUNTER — Emergency Department: Payer: Medicaid Other

## 2018-12-04 DIAGNOSIS — J449 Chronic obstructive pulmonary disease, unspecified: Secondary | ICD-10-CM

## 2018-12-04 DIAGNOSIS — R079 Chest pain, unspecified: Secondary | ICD-10-CM

## 2018-12-04 DIAGNOSIS — J029 Acute pharyngitis, unspecified: Secondary | ICD-10-CM

## 2018-12-04 DIAGNOSIS — R0602 Shortness of breath: Secondary | ICD-10-CM

## 2018-12-04 LAB — BASIC METABOLIC PANEL
Anion gap: 10 (ref 5–15)
BUN: 9 mg/dL (ref 6–20)
CO2: 25 mmol/L (ref 22–32)
Calcium: 8.9 mg/dL (ref 8.9–10.3)
Chloride: 103 mmol/L (ref 98–111)
Creatinine, Ser: 0.79 mg/dL (ref 0.44–1.00)
GFR calc Af Amer: >60 mL/min (ref >60)
GFR calc non Af Amer: >60 mL/min (ref >60)
Glucose, Bld: 124 mg/dL — ABNORMAL HIGH (ref 70–99)
Potassium: 3.4 mmol/L — ABNORMAL LOW (ref 3.5–5.1)
Sodium: 138 mmol/L (ref 135–145)

## 2018-12-04 LAB — TROPONIN I: Troponin I: <0.03 ng/mL (ref <0.03)

## 2018-12-04 MED ORDER — AZITHROMYCIN 250 MG PO TABS: ORAL_TABLET | ORAL | 0 refills | Status: DC

## 2018-12-04 MED ORDER — PREDNISONE 20 MG PO TABS: ORAL_TABLET | ORAL | 0 refills | Status: DC

## 2018-12-04 MED ORDER — HYDROCOD POLST-CPM POLST ER 10-8 MG/5ML PO SUER: 5.0000 mL | Freq: Two times a day (BID) | ORAL | 0 refills | Status: DC

## 2018-12-04 MED ORDER — PREDNISONE 20 MG PO TABS
60.0000 mg | ORAL_TABLET | Freq: Once | ORAL | Status: AC
  Administered 2018-12-04: 60 mg via ORAL
  Filled 2018-12-04: qty 3

## 2018-12-04 MED ORDER — HYDROCOD POLST-CPM POLST ER 10-8 MG/5ML PO SUER
5.0000 mL | Freq: Once | ORAL | Status: AC
Start: 2018-12-04 — End: 2018-12-04
  Administered 2018-12-04: 5 mL via ORAL
  Filled 2018-12-04: qty 5

## 2018-12-04 NOTE — ED Notes (Signed)
Pt remains in wheelchair for xray but tired of sitting in the hard wheelchair; assisted to recliner in subwait; call bell in reach;

## 2018-12-04 NOTE — ED Provider Notes (Signed)
[facility] Emergency Department Provider Note   ____________________________________________   First MD Initiated Contact with Patient 12/04/18 [phone]     (approximate)  I have reviewed the triage vital signs and the nursing notes.   HISTORY  Chief Complaint Sore Throat; Chest Pain; and Cough    HPI Kim Mcintyre is a 58 y.o. female who presents to the ED from home with a chief complaint of sore throat.  Patient has a history of COPD and has had productive cough for the past year.  Still smokes.  States she has heard occasional wheezing this past week which clears with her albuterol MDI.  Denies fever, chest pain, shortness of breath, abdominal pain, nausea or vomiting.  Denies recent travel, trauma or exposure to persons diagnosed with coronavirus.        Past Medical History:  Diagnosis Date  . COPD (chronic obstructive pulmonary disease) [facility])     Patient Active Problem List   Diagnosis Date Noted  . 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 Anxiety 11/15/2017  . Panlobular emphysema (HCC) 11/15/2017    Past Surgical History:  Procedure Laterality Date  . arm surgery Right   . ECTOPIC PREGNANCY SURGERY      Prior to Admission medications   Medication Sig Start Date End Date Taking? Authorizing Provider  albuterol (PROAIR HFA) 108 (90 Base) MCG/ACT inhaler Inhale 1-2 puffs into the lungs every 6 (six) hours as needed for wheezing or shortness of breath. 08/18/18   Galen Manila, NP  azithromycin (ZITHROMAX Z-PAK) 250 MG tablet Take as directed 12/04/18   Irean Hong, MD  budesonide-formoterol [facility]) 160-4.5 MCG/ACT inhaler Inhale 2 puffs into the lungs 2 (two) times daily. NEED APPT 08/18/18   Galen Manila, NP  chlorpheniramine-HYDROcodone [facility] PENNKINETIC ER) 10-8 MG/5ML SUER Take 5 mLs by mouth 2 (two) times daily. 12/04/18   Irean Hong, MD  predniSONE (DELTASONE) 20 MG tablet 3 tablets daily  x 4 days 12/04/18   Irean Hong, MD  tiotropium (SPIRIVA HANDIHALER) 18 MCG inhalation capsule Place 1 capsule (18 mcg total) into inhaler and inhale daily. 08/18/18   Galen Manila, NP    Allergies Patient has no known allergies.  Family History  Problem Relation Age of Onset  . Cancer Mother        lung  . Cancer Father        throat    Social History Social History   Tobacco Use  . Smoking status: Current Every Day Smoker    Packs/day: 0.50    Years: 40.00    Pack years: 20.00  . Smokeless tobacco: Never Used  . Tobacco comment: 1/2 ppd "or more" a day  Substance Use Topics  . Alcohol use: No    Frequency: Never  . Drug use: No    Review of Systems  Constitutional: No fever/chills Eyes: No visual changes. ENT: Positive for sore throat. Cardiovascular: Denies chest pain. Respiratory: Positive for occasional wheezing.  Denies shortness of breath. Gastrointestinal: No abdominal pain.  No nausea, no vomiting.  No diarrhea.  No constipation. Genitourinary: Negative for dysuria. Musculoskeletal: Negative for back pain. Skin: Negative for rash. Neurological: Negative for headaches, focal weakness or numbness.   ____________________________________________   PHYSICAL EXAM:  VITAL SIGNS: ED Triage Vitals  Enc Vitals Group     BP 12/03/18 2318 (!) 97/49     Pulse Rate 12/03/18 2318 76     Resp 12/03/18  2318 (!) 44     Temp 12/03/18 2318 98.3 F (36.8 C)     Temp Source 12/03/18 2318 Oral     SpO2 12/03/18 2318 96 %     Weight 12/03/18 2319 113 lb (51.3 kg)     Height 12/03/18 2319 4\' 11"  (1.499 m)     Head Circumference --      Peak Flow --      Pain Score 12/03/18 2319 8     Pain Loc --      Pain Edu? --      Excl. in GC? --     Constitutional: Asleep, awakened for exam.  Alert and oriented. Well appearing and in no acute distress. Eyes: Conjunctivae are normal. PERRL. EOMI. Head: Atraumatic. Nose: No congestion/rhinnorhea. Mouth/Throat:  Mucous membranes are moist.  Oropharynx mildly erythematous without tonsillar swelling, exudates or peritonsillar abscess.  There is no hoarse or muffled voice.  There is no drooling. Neck: No stridor.  Supple neck without meningismus. Hematological/Lymphatic/Immunilogical: Shotty right anterior cervical lymphadenopathy. Cardiovascular: Normal rate, regular rhythm. Grossly normal heart sounds.  Good peripheral circulation. Respiratory: Normal respiratory effort.  No retractions. Lungs CTAB.  No wheezing. Gastrointestinal: Soft and nontender. No distention. No abdominal bruits. No CVA tenderness. Musculoskeletal: No lower extremity tenderness nor edema.  No joint effusions. Neurologic:  Normal speech and language. No gross focal neurologic deficits are appreciated. No gait instability. Skin:  Skin is warm, dry and intact. No rash noted. Psychiatric: Mood and affect are normal. Speech and behavior are normal.  ____________________________________________   LABS (all labs ordered are listed, but only abnormal results are displayed)  Labs Reviewed  BASIC METABOLIC PANEL - Abnormal; Notable for the following components:      Result Value   Potassium 3.4 (*)    Glucose, Bld 124 (*)    All other components within normal limits  CBC - Abnormal; Notable for the following components:   MCH 34.1 (*)    All other components within normal limits  TROPONIN I   ____________________________________________  EKG  ED ECG REPORT I, SUNG,JADE J, the attending physician, personally viewed and interpreted this ECG.   Date: 12/04/2018  EKG Time: 2312  Rate: 76  Rhythm: normal EKG, normal sinus rhythm  Axis: Normal  Intervals:none  ST&T Change: Nonspecific  ____________________________________________  RADIOLOGY  ED MD interpretation: No acute cardiopulmonary process  Official radiology report(s): Dg Chest Port 1 View  Result Date: 12/04/2018 CLINICAL DATA:  Chest pain EXAM: PORTABLE CHEST  1 VIEW COMPARISON:  09/02/2017 FINDINGS: The heart size and mediastinal contours are within normal limits. Both lungs are clear. The visualized skeletal structures are unremarkable. IMPRESSION: No active disease. Electronically Signed   By: Deatra RobinsonKevin  Herman M.D.   On: 12/04/2018 03:21    ____________________________________________   PROCEDURES  Procedure(s) performed (including Critical Care):  Procedures   ____________________________________________   INITIAL IMPRESSION / ASSESSMENT AND PLAN / ED COURSE  As part of my medical decision making, I reviewed the following data within the electronic MEDICAL RECORD NUMBER Nursing notes reviewed and incorporated, Labs reviewed, Old chart reviewed, Radiograph reviewed and Notes from prior ED visits     Kim Mcintyre was evaluated in Emergency Department on 12/04/2018 for the symptoms described in the history of present illness. She was evaluated in the context of the global COVID-19 pandemic, which necessitated consideration that the patient might be at risk for infection with the SARS-CoV-2 virus that causes COVID-19. Institutional protocols and algorithms that pertain to  the evaluation of patients at risk for COVID-19 are in a state of rapid change based on information released by regulatory bodies including the CDC and federal and state organizations. These policies and algorithms were followed during the patient's care in the ED.    58 year old female with COPD who presents with occasional wheezing this week and sore throat. Differential includes, but is not limited to, viral syndrome, bronchitis including COPD exacerbation, pneumonia, reactive airway disease including asthma, CHF including exacerbation with or without pulmonary/interstitial edema, pneumothorax, ACS, thoracic trauma, and pulmonary embolism.  Laboratory, EKG and chest x-ray findings unremarkable.  Offered patient nebulizer treatment but she declines at this time.  Will start prednisone,  Tussionex for cough.  Will obtain strep swab.   Clinical Course as of Dec 04 634  Mon Dec 04, 2018  1610 Lab unable to process rapid strep swab.  Will place patient on antibiotic empirically.  Strict return precautions given.  Patient verbalizes understanding agrees with plan of care.   [JS]    Clinical Course User Index [JS] Irean Hong, MD     ____________________________________________   FINAL CLINICAL IMPRESSION(S) / ED DIAGNOSES  Final diagnoses:  Chronic obstructive pulmonary disease, unspecified COPD type (HCC)  Sore throat  Pharyngitis, unspecified etiology     ED Discharge Orders         Ordered    predniSONE (DELTASONE) 20 MG tablet     12/04/18 0635    chlorpheniramine-HYDROcodone (TUSSIONEX PENNKINETIC ER) 10-8 MG/5ML SUER  2 times daily     12/04/18 0635    azithromycin (ZITHROMAX Z-PAK) 250 MG tablet     12/04/18 9604           Note:  This document was prepared using Dragon voice recognition software and may include unintentional dictation errors.   Irean Hong, MD 12/04/18 [phone]

## 2018-12-04 NOTE — ED Notes (Signed)
Eyes closed, resp and unlabored;

## 2018-12-04 NOTE — ED Notes (Signed)
Strep test cancelled per MD Dolores Frame.

## 2018-12-04 NOTE — Discharge Instructions (Addendum)
1.  Finish steroid as prescribed (Prednisone 60 mg daily x4 days). 2.  Take Z-Pak antibiotic as prescribed. 3.  You may take Tussionex twice daily as needed for cough. 4.  Return to the ER for worsening symptoms, persistent vomiting, difficulty breathing or other concerns.

## 2018-12-25 ENCOUNTER — Encounter: Payer: Self-pay | Admitting: Family Medicine

## 2018-12-25 ENCOUNTER — Other Ambulatory Visit: Payer: Self-pay

## 2018-12-25 ENCOUNTER — Ambulatory Visit (INDEPENDENT_AMBULATORY_CARE_PROVIDER_SITE_OTHER): Payer: Medicaid Other | Admitting: Family Medicine

## 2018-12-25 DIAGNOSIS — J040 Acute laryngitis: Secondary | ICD-10-CM | POA: Diagnosis not present

## 2018-12-25 DIAGNOSIS — M549 Dorsalgia, unspecified: Secondary | ICD-10-CM

## 2018-12-25 DIAGNOSIS — M150 Primary generalized (osteo)arthritis: Secondary | ICD-10-CM

## 2018-12-25 DIAGNOSIS — M542 Cervicalgia: Secondary | ICD-10-CM

## 2018-12-25 DIAGNOSIS — Z800 Family history of malignant neoplasm of digestive organs: Secondary | ICD-10-CM | POA: Diagnosis not present

## 2018-12-25 DIAGNOSIS — G8929 Other chronic pain: Secondary | ICD-10-CM

## 2018-12-25 DIAGNOSIS — M159 Polyosteoarthritis, unspecified: Secondary | ICD-10-CM

## 2018-12-25 NOTE — Progress Notes (Signed)
Virtual Visit via Telephone The purpose of this virtual visit is to provide medical care while limiting exposure to the novel coronavirus (COVID19) for both patient and office staff.  Consent was obtained for phone visit:  Yes.   Answered questions that patient had about telehealth interaction:  Yes.   I discussed the limitations, risks, security and privacy concerns of performing an evaluation and management service by telephone. I also discussed with the patient that there may be a patient responsible charge related to this service. The patient expressed understanding and agreed to proceed.  Patient Location: Home Provider Location: [facility] (Office)  PCP is Wilhelmina Mcardle, AGPCNP-BC - I am currently covering during her maternity leave.   ---------------------------------------------------------------------- Chief Complaint  Patient presents with  . Hospitalization Follow-up    COPD obtw Neck pain radiate to back pain    S: Reviewed CMA documentation. I have called patient and gathered additional HPI as follows:  ED FOLLOW-UP VISIT  Hospital/Location: [facility] Date of ED Visit: 12/04/18  Reason for Presenting to ED: Cough Dyspnea Primary (+Secondary) Diagnosis: Panlobular Emphysema COPD / Neck Pain / Hoarse Voice  FOLLOW-UP - ED provider note and record have been reviewed - Patient presents today about 3 weeks after recent ED visit. Brief summary of recent course, patient had symptoms of shortness of breath and cough for and sore throat, presented to ED, chest x-ray and labs, given prednisone and cough syrup and dc'd on Azithromycin prednisone  - Today reports overall has done fairly well after discharge from ED. Her breathing is improved. Cough reduced. She still has some hoarse voice laryngitis, and she has concern fam history of throat cancer, as smoker wanted to get this checked out, requesting ENT referral.  Additional complaint Osteoarthritis multiple joints,  neck / back pain Reports chronic history of these problems, with pain, now worse in neck, describes difficulty finding comfortable position, in past took Meloxicam 7.5mg  daily, also taking Tylenol regularly, requesting x-ray imaging and evaluation. Last discussed w/ PCP in 2019  Denies any high risk travel to areas of current concern for COVID19. Denies any known or suspected exposure to person with or possibly with COVID19.  Denies any fevers, chills, sweats, body ache, cough, shortness of breath, sinus pain or pressure, headache, abdominal pain, diarrhea  Past Medical History:  Diagnosis Date  . COPD (chronic obstructive pulmonary disease) (HCC)    Social History   Tobacco Use  . Smoking status: Current Every Day Smoker    Packs/day: 0.50    Years: 40.00    Pack years: 20.00  . Smokeless tobacco: Never Used  . Tobacco comment: 1/2 ppd "or more" a day  Substance Use Topics  . Alcohol use: No    Frequency: Never  . Drug use: No    Current Outpatient Medications:  .  albuterol (PROAIR HFA) 108 (90 Base) MCG/ACT inhaler, Inhale 1-2 puffs into the lungs every 6 (six) hours as needed for wheezing or shortness of breath., Disp: 18 g, Rfl: 5 .  budesonide-formoterol (SYMBICORT) 160-4.5 MCG/ACT inhaler, Inhale 2 puffs into the lungs 2 (two) times daily. NEED APPT, Disp: 3 Inhaler, Rfl: 4 .  tiotropium (SPIRIVA HANDIHALER) 18 MCG inhalation capsule, Place 1 capsule (18 mcg total) into inhaler and inhale daily., Disp: 90 capsule, Rfl: 4  Depression screen [facility] 2/9 12/25/2018 11/15/2017 10/04/2017  Decreased Interest 0 0 1  Down, Depressed, Hopeless 0 0 1  PHQ - 2 Score 0 0 2  Altered sleeping - 0 3  Tired, decreased energy - 0 1  Change in appetite - 0 3  Feeling bad or failure about yourself  - 0 1  Trouble concentrating - 0 1  Moving slowly or fidgety/restless - 0 1  Suicidal thoughts - 0 0  PHQ-9 Score - 0 12  Difficult doing work/chores - Not difficult at all -    GAD 7 :  Generalized Anxiety Score 12/25/2018 11/15/2017 10/04/2017  Nervous, Anxious, on Edge (No Data) 0 3  Control/stop worrying - 0 2  Worry too much - different things - 0 2  Trouble relaxing - 0 3  Restless - 0 3  Easily annoyed or irritable - 1 3  Afraid - awful might happen - 0 1  Total GAD 7 Score - 1 17  Anxiety Difficulty - Not difficult at all Not difficult at all    -------------------------------------------------------------------------- O: No physical exam performed due to remote telephone encounter.   Recent Results (from the past 2160 hour(s))  Basic metabolic panel     Status: Abnormal   Collection Time: 12/03/18 11:35 PM  Result Value Ref Range   Sodium 138 135 - 145 mmol/L   Potassium 3.4 (L) 3.5 - 5.1 mmol/L   Chloride 103 98 - 111 mmol/L   CO2 25 22 - 32 mmol/L   Glucose, Bld 124 (H) 70 - 99 mg/dL   BUN 9 6 - 20 mg/dL   Creatinine, Ser 6.72 0.44 - 1.00 mg/dL   Calcium 8.9 8.9 - 09.4 mg/dL   GFR calc non Af Amer >60 >60 mL/min   GFR calc Af Amer >60 >60 mL/min   Anion gap 10 5 - 15    Comment: Performed at [facility], [address] [place], [place] [address]  CBC     Status: Abnormal   Collection Time: 12/03/18 11:35 PM  Result Value Ref Range   WBC 6.6 4.0 - 10.5 K/uL   RBC 3.96 3.87 - 5.11 MIL/uL   Hemoglobin 13.5 12.0 - 15.0 g/dL   HCT 83.6 62.9 - 47.6 %   MCV 99.2 80.0 - 100.0 fL   MCH 34.1 (H) 26.0 - 34.0 pg   MCHC 34.4 30.0 - 36.0 g/dL   RDW 54.6 50.3 - 54.6 %   Platelets 175 150 - 400 K/uL   nRBC 0.0 0.0 - 0.2 %    Comment: Performed at [facility], [address] [place], [place] [address]  Troponin I - ONCE - STAT     Status: None   Collection Time: 12/03/18 11:35 PM  Result Value Ref Range   Troponin I <0.03 <0.03 ng/mL    Comment: Performed at [facility], [address] [place], [place] [address]    -------------------------------------------------------------------------- A&P:  Problem List Items  Addressed This Visit    None    Visit Diagnoses    Primary osteoarthritis involving multiple joints    -  Primary   Relevant Orders   Ambulatory referral to Orthopedic Surgery  Chronic neck and back pain      Relevant Orders  Ambulatory referral to Orthopedic Surgery   Clinically with persistent chronic issue now worsening chronic neck pain and back pain with known OA/DJD other joints, with suspected spinal disease, no recent x-ray imaging on file  Referral to [facility] Orthoped[facility] for further evaluation, imaging, management of neck/back pain     Laryngitis       Relevant Orders   Ambulatory referral to ENT   Family history of  throat cancer       Relevant Orders   Ambulatory referral to ENT              Clinically with chronic problem panlobular emphysema and now worsening laryngitis hoarseness, active smoker - recent COPD flare s/p treatment with improvement - still has residual laryngitis and voice change requesting evaluation by ENT since she has strong fam history of throat cancer around age 58, requested consultation - referral placed   Orders Placed This Encounter  Procedures  . Ambulatory referral to ENT    Referral Priority:   Routine    Referral Type:   Consultation    Referral Reason:   Specialty Services Required    Requested Specialty:   Otolaryngology    Number of Visits Requested:   1  . Ambulatory referral to Orthopedic Surgery    Referral Priority:   Routine    Referral Type:   Surgical    Referral Reason:   Specialty Services Required    Requested Specialty:   Orthopedic Surgery    Number of Visits Requested:   1    No orders of the defined types were placed in this encounter.   Follow-up: - Return in 6 weeks to 3 months w/ PCP follow-up Osteoarthritis Spine Neck/Back Pain, COPD  Patient verbalizes understanding with the above medical recommendations including the limitation of remote medical advice.  Specific follow-up and call-back criteria were  given for patient to follow-up or seek medical care more urgently if needed.   - Time spent in direct consultation with patient on phone: 12 minutes  Saralyn PilarAlexander Sage Kopera, DO [facility] 12/25/2018, 2:27 PM

## 2018-12-25 NOTE — Patient Instructions (Addendum)
Referral placed to ENT and Orthopedic, stay tuned for apt  [facility] ENT [facility] [address]  [place], [place] [address] Ph: [phone]  [facility] ORTHOPEDICS & [facility] MEDICINE [facility] [address] [place], [place]  [address] Phone: [phone]  Please schedule a Follow-up Appointment to: Return in about 3 months (around 03/27/2019) for COPD, Neck/Back Pain.  If you have any other questions or concerns, please feel free to call the office or send a message through MyChart. You may also schedule an earlier appointment if necessary.  Additionally, you may be receiving a survey about your experience at our office within a few days to 1 week by e-mail or mail. We value your feedback.  Saralyn Pilar, DO [facility], [place]

## 2019-02-22 ENCOUNTER — Other Ambulatory Visit: Payer: Self-pay | Admitting: Nurse Practitioner

## 2019-02-22 DIAGNOSIS — J431 Panlobular emphysema: Secondary | ICD-10-CM

## 2019-11-20 IMAGING — CR DG CHEST 2V
2 series · 2 of 2 positions shown · non-contrast
Comparison: Report 03/21/2003

CLINICAL DATA: Cough and fever

EXAM:
CHEST  2 VIEW

[chest pa]
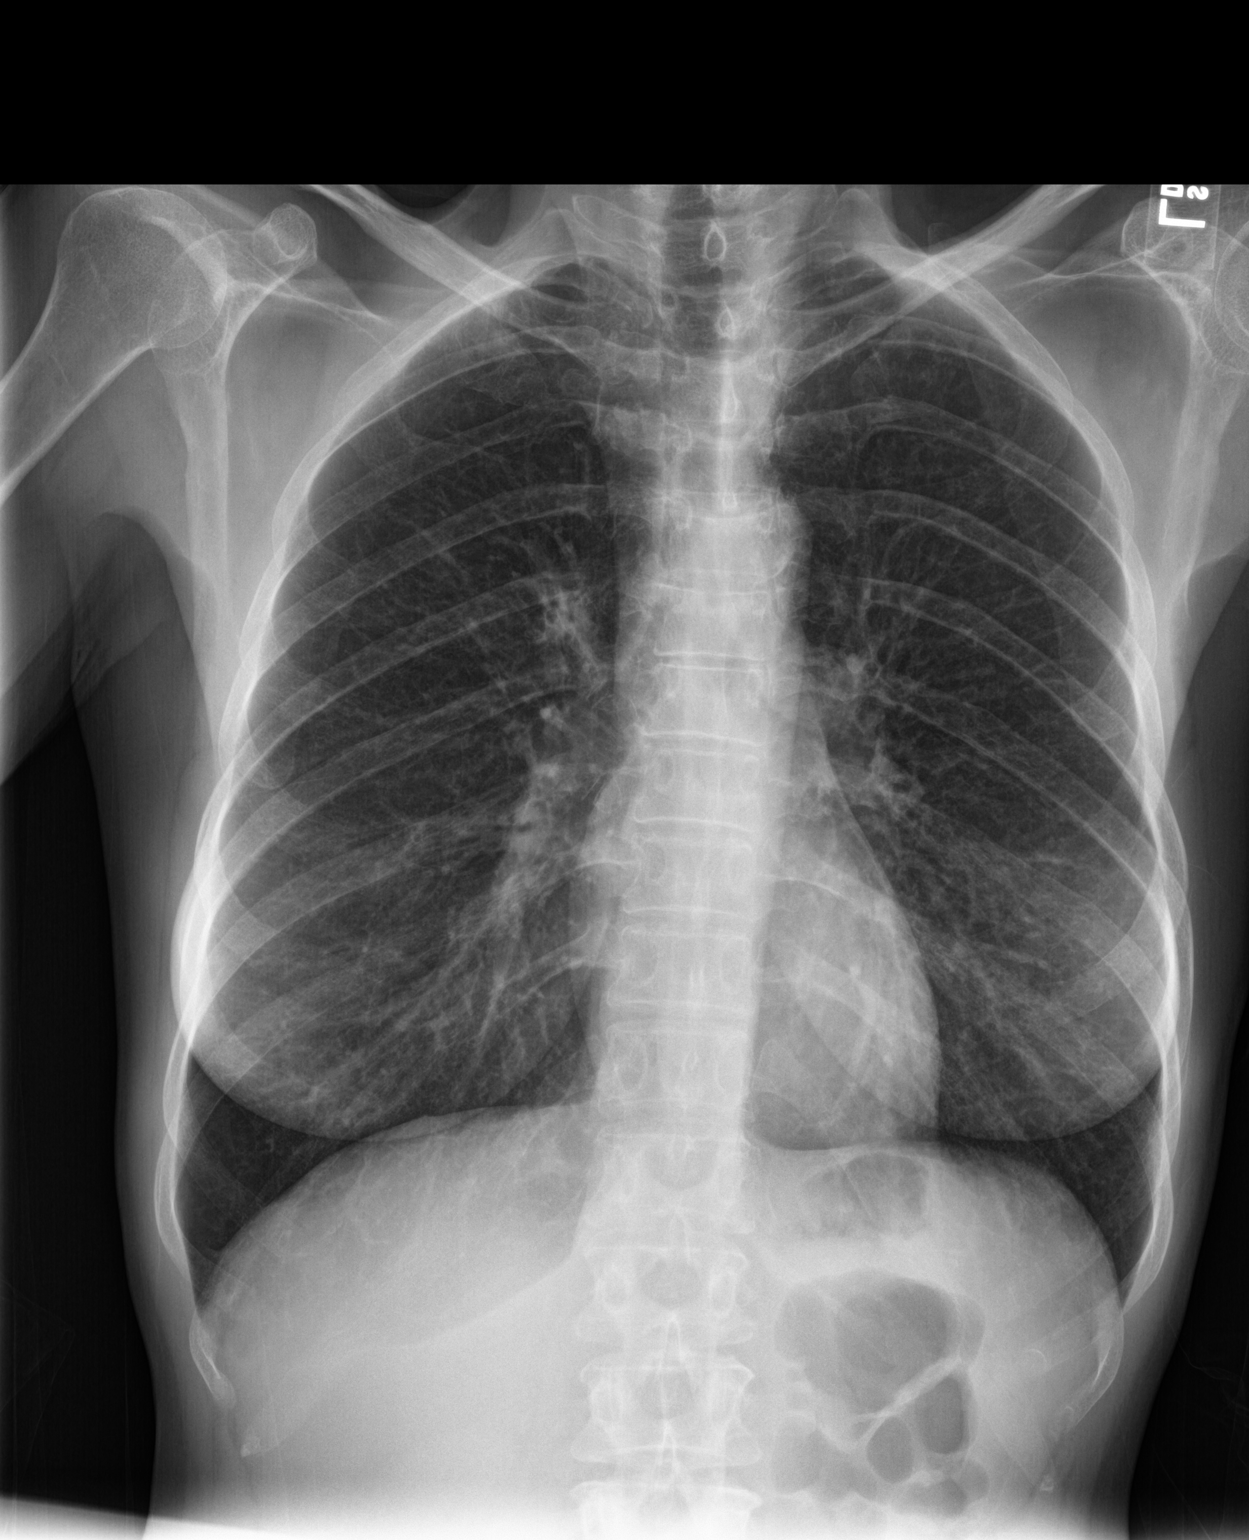

[chest lat]
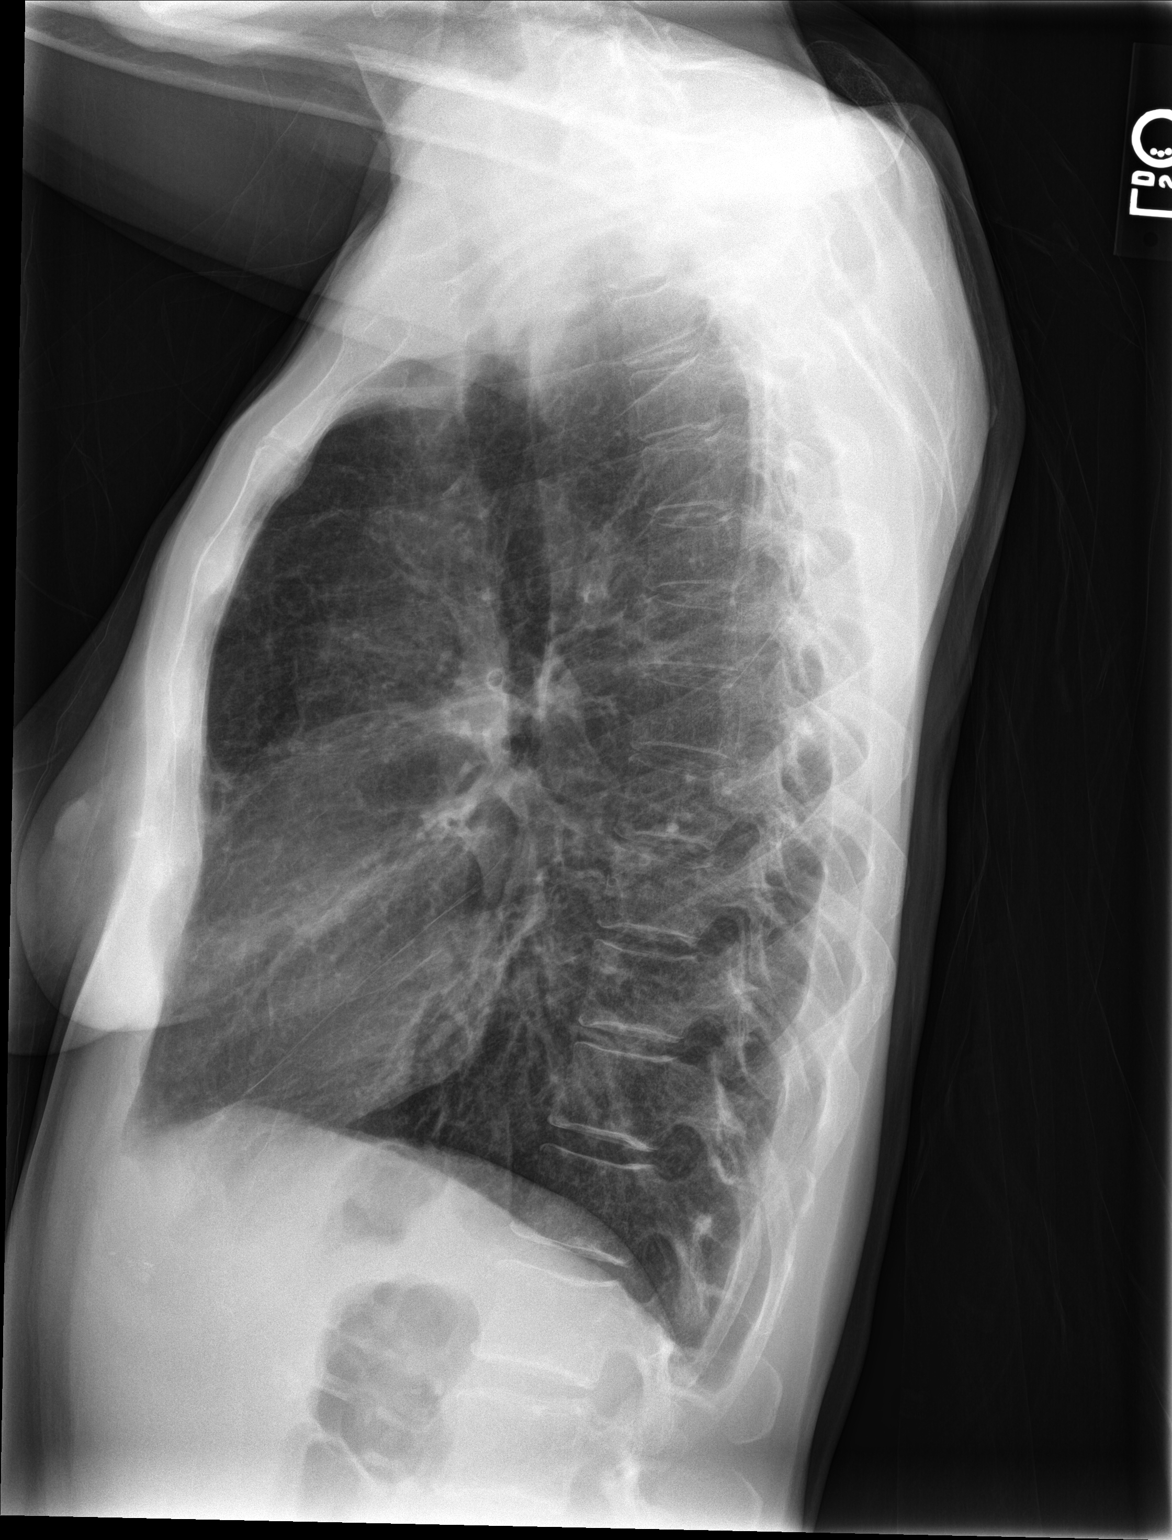

[2 of 2 positions shown; findings below may reference images not displayed]

FINDINGS: The heart size and mediastinal contours are within normal limits.
Both lungs are clear. The visualized skeletal structures are
unremarkable.
IMPRESSION: No active cardiopulmonary disease.

## 2020-09-03 ENCOUNTER — Ambulatory Visit
Admission: RE | Admit: 2020-09-03 | Discharge: 2020-09-03 | Disposition: A | Discharge status: Home / Self Care | Payer: Medicare Other | Source: Ambulatory Visit | Attending: Family Medicine | Admitting: Family Medicine

## 2020-09-03 ENCOUNTER — Ambulatory Visit (INDEPENDENT_AMBULATORY_CARE_PROVIDER_SITE_OTHER): Payer: Medicare Other

## 2020-09-03 ENCOUNTER — Other Ambulatory Visit: Payer: Self-pay

## 2020-09-03 VITALS — BP 110/73 | HR 70 | Temp 98.2°F | Resp 18 | Ht <= 58 in | Wt 125.0 lb

## 2020-09-03 DIAGNOSIS — M25562 Pain in left knee: Secondary | ICD-10-CM | POA: Diagnosis not present

## 2020-09-03 DIAGNOSIS — R0789 Other chest pain: Secondary | ICD-10-CM

## 2020-09-03 DIAGNOSIS — R0781 Pleurodynia: Secondary | ICD-10-CM

## 2020-09-03 DIAGNOSIS — M7918 Myalgia, other site: Secondary | ICD-10-CM

## 2020-09-03 MED ORDER — HYDROCODONE-ACETAMINOPHEN 5-325 MG PO TABS: 1.0000 | ORAL_TABLET | Freq: Three times a day (TID) | ORAL | 0 refills | Status: AC | PRN
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
## 2020-09-03 NOTE — Discharge Instructions (Signed)
Xrays negative.  Medication as prescribed.  Take care  Dr. Adriana Simas
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
